# Patient Record
Sex: Female | Born: 1935 | Race: White | Hispanic: No
Health system: Southern US, Community
[De-identification: ages and names within clinical notes are randomized; demographics above are authoritative.]

---

## 2004-09-14 ENCOUNTER — Ambulatory Visit: Payer: Self-pay | Admitting: Neurology

## 2004-09-25 ENCOUNTER — Encounter: Payer: Self-pay | Admitting: Neurology

## 2004-09-26 ENCOUNTER — Ambulatory Visit: Payer: Self-pay | Admitting: Internal Medicine

## 2004-10-10 ENCOUNTER — Encounter: Payer: Self-pay | Admitting: Neurology

## 2004-10-30 ENCOUNTER — Ambulatory Visit: Payer: Self-pay | Admitting: *Deleted

## 2005-03-15 ENCOUNTER — Ambulatory Visit: Payer: Self-pay | Admitting: Internal Medicine

## 2005-11-02 ENCOUNTER — Ambulatory Visit: Payer: Self-pay | Admitting: Internal Medicine

## 2006-11-26 ENCOUNTER — Ambulatory Visit: Payer: Self-pay | Admitting: Internal Medicine

## 2007-10-20 ENCOUNTER — Ambulatory Visit: Payer: Self-pay | Admitting: Internal Medicine

## 2008-08-12 ENCOUNTER — Emergency Department: Payer: Self-pay | Admitting: Emergency Medicine

## 2008-08-12 ENCOUNTER — Other Ambulatory Visit: Payer: Self-pay

## 2008-11-25 ENCOUNTER — Ambulatory Visit: Payer: Self-pay | Admitting: Internal Medicine

## 2009-04-04 ENCOUNTER — Encounter: Admission: RE | Admit: 2009-04-04 | Discharge: 2009-04-04 | Payer: Self-pay | Admitting: Neurology

## 2009-04-26 ENCOUNTER — Encounter: Admission: RE | Admit: 2009-04-26 | Discharge: 2009-04-26 | Payer: Self-pay | Admitting: Neurology

## 2009-11-29 ENCOUNTER — Ambulatory Visit: Payer: Self-pay | Admitting: Internal Medicine

## 2010-03-27 ENCOUNTER — Encounter: Payer: Self-pay | Admitting: General Practice

## 2010-04-09 ENCOUNTER — Encounter: Payer: Self-pay | Admitting: General Practice

## 2010-05-10 ENCOUNTER — Encounter: Payer: Self-pay | Admitting: General Practice

## 2010-11-30 ENCOUNTER — Ambulatory Visit: Payer: Self-pay | Admitting: Internal Medicine

## 2011-12-24 ENCOUNTER — Ambulatory Visit: Payer: Self-pay | Admitting: Internal Medicine

## 2012-12-29 ENCOUNTER — Ambulatory Visit: Payer: Self-pay | Admitting: Internal Medicine

## 2013-06-23 ENCOUNTER — Inpatient Hospital Stay: Payer: Self-pay | Admitting: Internal Medicine

## 2013-06-23 LAB — COMPREHENSIVE METABOLIC PANEL
BUN: 23 mg/dL — ABNORMAL HIGH (ref 7–18)
Calcium, Total: 9.4 mg/dL (ref 8.5–10.1)
Chloride: 101 mmol/L (ref 98–107)
Potassium: 4.1 mmol/L (ref 3.5–5.1)
Sodium: 136 mmol/L (ref 136–145)
Total Protein: 8 g/dL (ref 6.4–8.2)

## 2013-06-23 LAB — URINALYSIS, COMPLETE
Bacteria: NONE SEEN
Ketone: NEGATIVE
Leukocyte Esterase: NEGATIVE
Ph: 6 (ref 4.5–8.0)
Protein: NEGATIVE
RBC,UR: 4 /HPF (ref 0–5)
Specific Gravity: 1.01 (ref 1.003–1.030)
WBC UR: <1 /HPF (ref 0–5)

## 2013-06-23 LAB — CBC
HGB: 14.5 g/dL (ref 12.0–16.0)
MCH: 32.5 pg (ref 26.0–34.0)
RBC: 4.47 10*6/uL (ref 3.80–5.20)
RDW: 13.7 % (ref 11.5–14.5)
WBC: 5.6 10*3/uL (ref 3.6–11.0)

## 2013-06-23 LAB — SEDIMENTATION RATE: Erythrocyte Sed Rate: 7 mm/hr (ref 0–30)

## 2013-06-23 LAB — TROPONIN I
Troponin-I: <0.02 ng/mL
Troponin-I: <0.02 ng/mL

## 2013-06-24 LAB — CBC WITH DIFFERENTIAL/PLATELET
Basophil #: 0 10*3/uL (ref 0.0–0.1)
Eosinophil #: 0 10*3/uL (ref 0.0–0.7)
HCT: 38 % (ref 35.0–47.0)
HGB: 13.4 g/dL (ref 12.0–16.0)
Lymphocyte #: 1.8 10*3/uL (ref 1.0–3.6)
Lymphocyte %: 34.4 %
MCHC: 35.2 g/dL (ref 32.0–36.0)
Monocyte #: 0.5 x10 3/mm (ref 0.2–0.9)
Monocyte %: 9.1 %
Neutrophil %: 55.2 %
RDW: 13.9 % (ref 11.5–14.5)
WBC: 5.2 10*3/uL (ref 3.6–11.0)

## 2013-06-24 LAB — BASIC METABOLIC PANEL
Anion Gap: 6 — ABNORMAL LOW (ref 7–16)
BUN: 24 mg/dL — ABNORMAL HIGH (ref 7–18)
Chloride: 106 mmol/L (ref 98–107)
EGFR (African American): 53 — ABNORMAL LOW
EGFR (Non-African Amer.): 45 — ABNORMAL LOW
Glucose: 103 mg/dL — ABNORMAL HIGH (ref 65–99)

## 2013-06-24 LAB — LIPID PANEL
Cholesterol: 215 mg/dL — ABNORMAL HIGH (ref 0–200)
HDL Cholesterol: 46 mg/dL (ref 40–60)
Ldl Cholesterol, Calc: 147 mg/dL — ABNORMAL HIGH (ref 0–100)
Triglycerides: 109 mg/dL (ref 0–200)
VLDL Cholesterol, Calc: 22 mg/dL (ref 5–40)

## 2013-06-24 LAB — TROPONIN I: Troponin-I: <0.02 ng/mL

## 2014-01-04 ENCOUNTER — Ambulatory Visit: Payer: Self-pay | Admitting: Internal Medicine

## 2014-09-09 ENCOUNTER — Ambulatory Visit: Payer: Self-pay | Admitting: Internal Medicine

## 2014-09-29 ENCOUNTER — Inpatient Hospital Stay: Payer: Self-pay | Admitting: Internal Medicine

## 2014-09-29 LAB — BASIC METABOLIC PANEL
ANION GAP: 7 (ref 7–16)
BUN: 47 mg/dL — ABNORMAL HIGH (ref 7–18)
CALCIUM: 9 mg/dL (ref 8.5–10.1)
CHLORIDE: 103 mmol/L (ref 98–107)
CO2: 28 mmol/L (ref 21–32)
CREATININE: 1.57 mg/dL — AB (ref 0.60–1.30)
EGFR (African American): 41 — ABNORMAL LOW
EGFR (Non-African Amer.): 34 — ABNORMAL LOW
Glucose: 76 mg/dL (ref 65–99)
OSMOLALITY: 287 (ref 275–301)
Potassium: 4.2 mmol/L (ref 3.5–5.1)
Sodium: 138 mmol/L (ref 136–145)

## 2014-09-29 LAB — URINALYSIS, COMPLETE
BACTERIA: NONE SEEN
Blood: NEGATIVE
Glucose,UR: NEGATIVE mg/dL (ref 0–75)
Leukocyte Esterase: NEGATIVE
NITRITE: NEGATIVE
Ph: 5 (ref 4.5–8.0)
RBC,UR: 5 /HPF (ref 0–5)
SPECIFIC GRAVITY: 1.026 (ref 1.003–1.030)
Squamous Epithelial: 2

## 2014-09-29 LAB — CBC
HCT: 44.2 % (ref 35.0–47.0)
HGB: 14.1 g/dL (ref 12.0–16.0)
MCH: 31.6 pg (ref 26.0–34.0)
MCHC: 32 g/dL (ref 32.0–36.0)
MCV: 99 fL (ref 80–100)
Platelet: 172 10*3/uL (ref 150–440)
RBC: 4.48 10*6/uL (ref 3.80–5.20)
RDW: 13.4 % (ref 11.5–14.5)
WBC: 7 10*3/uL (ref 3.6–11.0)

## 2014-09-29 LAB — TROPONIN I: Troponin-I: <0.02 ng/mL

## 2014-09-30 LAB — BASIC METABOLIC PANEL
Anion Gap: 9 (ref 7–16)
BUN: 40 mg/dL — ABNORMAL HIGH (ref 7–18)
CALCIUM: 8.7 mg/dL (ref 8.5–10.1)
CO2: 24 mmol/L (ref 21–32)
Chloride: 107 mmol/L (ref 98–107)
Creatinine: 1.12 mg/dL (ref 0.60–1.30)
EGFR (African American): >60
EGFR (Non-African Amer.): 50 — ABNORMAL LOW
GLUCOSE: 111 mg/dL — AB (ref 65–99)
OSMOLALITY: 290 (ref 275–301)
POTASSIUM: 4 mmol/L (ref 3.5–5.1)
Sodium: 140 mmol/L (ref 136–145)

## 2014-09-30 LAB — CBC WITH DIFFERENTIAL/PLATELET
BASOS ABS: 0 10*3/uL (ref 0.0–0.1)
BASOS PCT: 0.3 %
EOS ABS: 0 10*3/uL (ref 0.0–0.7)
Eosinophil %: 0.4 %
HCT: 41.9 % (ref 35.0–47.0)
HGB: 14.1 g/dL (ref 12.0–16.0)
LYMPHS PCT: 21.2 %
Lymphocyte #: 1.2 10*3/uL (ref 1.0–3.6)
MCH: 32.7 pg (ref 26.0–34.0)
MCHC: 33.7 g/dL (ref 32.0–36.0)
MCV: 97 fL (ref 80–100)
Monocyte #: 0.4 x10 3/mm (ref 0.2–0.9)
Monocyte %: 7.8 %
NEUTROS ABS: 4 10*3/uL (ref 1.4–6.5)
Neutrophil %: 70.3 %
Platelet: 155 10*3/uL (ref 150–440)
RBC: 4.32 10*6/uL (ref 3.80–5.20)
RDW: 13.1 % (ref 11.5–14.5)
WBC: 5.7 10*3/uL (ref 3.6–11.0)

## 2014-09-30 LAB — LIPID PANEL
CHOLESTEROL: 155 mg/dL (ref 0–200)
HDL: 35 mg/dL — AB (ref 40–60)
LDL CHOLESTEROL, CALC: 98 mg/dL (ref 0–100)
TRIGLYCERIDES: 108 mg/dL (ref 0–200)
VLDL CHOLESTEROL, CALC: 22 mg/dL (ref 5–40)

## 2014-10-03 LAB — CBC WITH DIFFERENTIAL/PLATELET
BASOS ABS: 0 10*3/uL (ref 0.0–0.1)
BASOS PCT: 0.5 %
EOS PCT: 0.8 %
Eosinophil #: 0.1 10*3/uL (ref 0.0–0.7)
HCT: 43.4 % (ref 35.0–47.0)
HGB: 14.5 g/dL (ref 12.0–16.0)
LYMPHS PCT: 20.5 %
Lymphocyte #: 1.2 10*3/uL (ref 1.0–3.6)
MCH: 32.5 pg (ref 26.0–34.0)
MCHC: 33.5 g/dL (ref 32.0–36.0)
MCV: 97 fL (ref 80–100)
Monocyte #: 0.5 x10 3/mm (ref 0.2–0.9)
Monocyte %: 8.4 %
NEUTROS ABS: 4.2 10*3/uL (ref 1.4–6.5)
Neutrophil %: 69.8 %
Platelet: 138 10*3/uL — ABNORMAL LOW (ref 150–440)
RBC: 4.47 10*6/uL (ref 3.80–5.20)
RDW: 13.3 % (ref 11.5–14.5)
WBC: 6 10*3/uL (ref 3.6–11.0)

## 2014-10-03 LAB — BASIC METABOLIC PANEL
ANION GAP: 9 (ref 7–16)
BUN: 39 mg/dL — ABNORMAL HIGH (ref 7–18)
CO2: 24 mmol/L (ref 21–32)
Calcium, Total: 8.8 mg/dL (ref 8.5–10.1)
Chloride: 103 mmol/L (ref 98–107)
Creatinine: 0.94 mg/dL (ref 0.60–1.30)
Glucose: 105 mg/dL — ABNORMAL HIGH (ref 65–99)
OSMOLALITY: 282 (ref 275–301)
Potassium: 3.9 mmol/L (ref 3.5–5.1)
Sodium: 136 mmol/L (ref 136–145)

## 2014-10-05 ENCOUNTER — Ambulatory Visit: Payer: Self-pay | Admitting: Neurology

## 2014-10-05 LAB — CBC WITH DIFFERENTIAL/PLATELET
Basophil #: 0 10*3/uL (ref 0.0–0.1)
Basophil %: 0.2 %
EOS ABS: 0 10*3/uL (ref 0.0–0.7)
Eosinophil %: 0.3 %
HCT: 41 % (ref 35.0–47.0)
HGB: 13.7 g/dL (ref 12.0–16.0)
Lymphocyte #: 0.7 10*3/uL — ABNORMAL LOW (ref 1.0–3.6)
Lymphocyte %: 7.9 %
MCH: 32.5 pg (ref 26.0–34.0)
MCHC: 33.4 g/dL (ref 32.0–36.0)
MCV: 97 fL (ref 80–100)
MONO ABS: 0.7 x10 3/mm (ref 0.2–0.9)
Monocyte %: 8.4 %
NEUTROS ABS: 7 10*3/uL — AB (ref 1.4–6.5)
Neutrophil %: 83.2 %
PLATELETS: 141 10*3/uL — AB (ref 150–440)
RBC: 4.22 10*6/uL (ref 3.80–5.20)
RDW: 13.3 % (ref 11.5–14.5)
WBC: 8.5 10*3/uL (ref 3.6–11.0)

## 2014-10-10 ENCOUNTER — Ambulatory Visit: Payer: Self-pay | Admitting: Internal Medicine

## 2014-11-09 DEATH — deceased

## 2015-04-01 NOTE — Discharge Summary (Signed)
PATIENT NAME:  Tammie Lane, Tammie Lane MR#:  [id number] DATE OF BIRTH:  12/03/1934  DATE OF ADMISSION:  06/23/2013 DATE OF DISCHARGE:  06/25/2013  DISCHARGE DIAGNOSES: 1.  Acute right retinal artery occlusion.  2.  Parkinson's disease.  3.  Altered mental status secondary to tramadol.  4.  Malignant hypertension.   DISCHARGE MEDICATIONS: 1.  Carbidopa/levodopa 25/100 mg 1/2 tablet t.i.d.  2.  Mirapex 0.5 mg t.i.d.  3.  Aspirin 81 mg daily.  4.  Senna S daily.  5.  Vitamin B12 1000 mcg daily. 6.  Plavix 75 mg daily.  7.  Amlodipine 2.5 mg b.i.d.   REASON FOR ADMISSION: A 81 year old female presents with right vision loss and headache. Please see H and P for HPI, past medical history and physical exam.  HOSPITAL COURSE: The patient was admitted. Brain MRI did not show an acute infarct. Carotid Doppler unremarkable. Echocardiogram showed normal LV systolic function with no significant valvular disease. Due to her right headache, Tramadol was given, and she subsequently had altered mental status and confusion with delirium for about 12 hours.  That resolved this morning. She is back to baseline.  Is instructed never to take that again. Dr. Sherryll BurgerShah of neurology suggested adding the Plavix. We will do that for 3 months. Her vision is about 50% improved this morning. She will follow up with ophthalmology. Her blood pressure has been up and down, sometimes fairly severe. This morning was 170/94. She will be on low-dose amlodipine to avoid hypotension with her Parkinson's.  One week followup.  ____________________________ Danella PentonMark F. Jamieka Royle, MD mfm:sb D: 06/25/2013 07:58:13 ET T: 06/25/2013 08:13:53 ET JOB#: [id number]  cc: Danella PentonMark F. Avian Greenawalt, MD, <Dictator> Danella PentonMARK F Eva Vallee MD ELECTRONICALLY SIGNED 06/25/2013 12:33

## 2015-04-01 NOTE — H&P (Signed)
PATIENT NAME:  Tammie Lane, Tammie Lane MR#:  [id number] DATE OF BIRTH:  [birth year]  DATE OF ADMISSION:  06/23/2013  PRIMARY CARE PHYSICIAN:  Bethann Punches, MD.  NEUROLOGIST:  Dr. Sherryll Burger.   CHIEF COMPLAINT:  Right eye visual loss.   HISTORY OF PRESENT ILLNESS:  This is a 81 year old female with history of hypertension and I think orthostatic hypotension, history of Parkinson's. Over the past 2 days, she has had this on-and-off black vision on the right eye. Today it is continuous and she cannot even see my fingers in front of her right eye. She did have a little nausea. She went to Dr. Larence Penning, optometry, who referred her to the Emergency Room for stroke versus central retinal artery occlusion. As per the family, she has had blood pressure shooting very high and also very low and she has been taken off blood pressure medications. She does complain of some chest pain, had it for an hour and a half today, crushing pain. She takes some acid reflux medications and seems to improve a little bit, and that was in the center of her chest, no radiation. Normally she does unsteady with gait and walks with a [place] secondary to her Parkinson's.   PAST MEDICAL HISTORY: 1.  Parkinson's. 2.  Hypertension. 2.  TIA.  PAST SURGICAL HISTORY: 1.  Tubal ligation. 2.  Hysterectomy.   ALLERGIES:  AVELOX.   MEDICATIONS:  Include: 1.  Aspirin 81 mg daily.  2.  Carbidopa, levodopa 25/100 1/2 tablet 3 times a day. 3.  Pramipexole 0.5 mg 3 times a day. 4.  Senna-S 50, 8.6 one tablet daily as needed for constipation. 5.  Vitamin B12 1000 mcg orally daily.   SOCIAL HISTORY: No smoking. No alcohol. No drug use. Worked in the past as a Careers adviser.   FAMILY HISTORY: Mother died at 39, had a CVA with paralysis one side and was like that 3 years before she died.  Father died at 34 of a MI.  REVIEW OF SYSTEMS:  CONSTITUTIONAL: Positive for generalized weakness. No fever, chills or sweats. No weight loss. No  weight gain. EYES:  Right eye black vision for the past 2 days. EARS, NOSE, MOUTH AND THROAT: Positive for runny nose. No sore throat. No difficulty swallowing.  CARDIOVASCULAR:  Occasional chest pain last day. No palpitations.  RESPIRATORY:  Positive for shortness of breath. No cough. No sputum. No hemoptysis.  GASTROINTESTINAL:  Positive constipation last month. Positive for nausea and no vomiting. No abdominal pain. No bright red blood per rectum. No melena.  GENITOURINARY:  No burning on urination.  No hematuria.  MUSCULOSKELETAL:  Positive for joint pain in the low back.  NEUROLOGIC:  No fainting or blackouts. Vision loss, right eye.  PSYCHIATRIC:  No anxiety or depression.  ENDOCRINE: No thyroid problems. HEMATOLOGIC AND LYMPHATIC:  No anemia.   PHYSICAL EXAMINATION: VITAL SIGNS: Temperature 97.4, pulse 62, respirations 18, blood pressure 231/100, pulse oximetry 96% on room air.  GENERAL:  No respiratory distress.  EYES:  Conjunctivae and lids normal. Pupils equal, round and reactive to light. Extraocular muscles intact. No nystagmus. VISION:  Right eye unable to see fingers in front of her right eye. EARS, NOSE, MOUTH AND THROAT: Tympanic membranes: No erythema. Nasal mucosa: No erythema.  Throat: No erythema, no exudate seen. Lips and gums normal.  NECK: No JVD. No bruits. No lymphadenopathy. No thyromegaly. No thyroid nodules palpated.  RESPIRATORY:  Lungs clear to auscultation. No use of accessory muscles to breathe. No  rhonchi, rales or wheeze heard.  CARDIOVASCULAR:  S1, S2 normal. No gallops, rubs or murmurs heard. Carotid upstroke 2+ bilaterally. No bruits. Dorsalis pedis pulses 2+ bilaterally. No edema of the lower extremity.  ABDOMEN: Soft, nontender. No organomegaly/splenomegaly. Normoactive bowel sounds. No masses felt.  LYMPHATIC:  No lymph nodes in the neck.  MUSCULOSKELETAL: The patient's lower extremities are stiff. No clubbing, edema or cyanosis.  SKIN: Positive  bruising, upper extremities. No other lesions seen. Scab on the back from prior dermatology procedure.  NEUROLOGIC: Vision loss right eye, other cranial nerves intact. Deep tendon reflexes half plus bilateral lower extremities. Power 5 out of 5 upper and lower extremities bilaterally. Babinski negative.  PSYCHIATRIC:  The patient is oriented to person, place and time.   LABORATORY AND RADIOLOGICAL DATA:  Urinalysis: 1+ blood.  CT scan of the head: Subtle hypodensity right basal ganglia.  White blood cell count 5.6, H and H 14.5 and 41.9, platelet count of 148, glucose 109, BUN 23, creatinine 1.17, sodium 136, potassium 4.1, chloride 101, CO2 26, calcium 9.4. Liver function tests normal.   EKG: Normal sinus rhythm, 60 beats per minute, left atrial enlargement, inferior infarct.   ASSESSMENT AND PLAN: 1.  Acute cerebrovascular accident.  Likely this is a retinal artery occlusion. Will admit to the hospital given MRI of the brain, carotid ultrasound, echocardiogram, monitor on telemetry. Since the patient does take aspirin, will take a step up to Plavix. If the stroke workup is negative, may end up needing an ophthalmology evaluation but the patient was seen as outpatient by Dr. Larence PenningNice, optometry, who referred him for further evaluation. I will also check a sedimentation rate to rule out temporal arteritis.  2.  Malignant hypertension and chest pain. Will give a dose of Altace 5 mg x 1 at bedtime and add nitro paste. Goal systolic blood pressure 198, tonight. Will get serial cardiac enzymes and monitor on telemetry. Chest pain could be secondary to the malignant hypertension.  3.  Parkinson's disease on decreased dose of Sinemet and Pramipexole .  4.  Constipation. Continue Senna-S.  5.  Dehydration. Will give gentle IV fluid hydration and recheck a creatinine in the a.m.  TIME SPENT ON ADMISSION:  55 minutes.  CODE STATUS:  THE PATIENT IS A FULL CODE.    ____________________________ Herschell Dimesichard J.  Renae GlossWieting, MD rjw:ce D: 06/23/2013 12:54:26 ET T: 06/23/2013 13:37:09 ET JOB#: [id number]  cc: Herschell Dimesichard J. Renae GlossWieting, MD, <Dictator> Danella PentonMark F. Miller, MD Hemang K. Sherryll BurgerShah, MD  Salley ScarletICHARD J Jamir Rone MD ELECTRONICALLY SIGNED 06/26/2013 17:18

## 2015-04-01 NOTE — Consult Note (Signed)
PATIENT NAME:  Tammie Lane, Tammie Lane MR#:  [id number] DATE OF BIRTH:  [birth year]  DATE OF CONSULTATION:  06/23/2013  REFERRING PHYSICIAN:  Alford Highland, MD   CONSULTING PHYSICIAN:  Hemang K. Sherryll Burger, MD PRIMARY CARE PHYSICIAN: Bethann Punches, MD   REASON FOR CONSULTATION: Right eye visual loss.   HISTORY OF PRESENT ILLNESS: Ms. Tammie Lane is a 81 year old Caucasian female patient of mine with Parkinson's disease with a recent autonomic dysfunction and fluctuation in her blood pressure. Yesterday around 06/22/2013, the patient woke up and felt like she was not able to see from her right eye, lasting for almost 2 hours or so but then it got better. When she woke up this morning on 06/23/2013, she had again no vision from her right eye, so she called her regular ophthalmologist and then her optometrist, Dr. Larence Penning, who referred her to the ER.   The patient has not had any improvement in her right eye vision, but other than that she did not notice any other focal weakness, numbness, or any speech difficulty or difficulty with her cognition.   The patient does have a history of other old stroke into her right parietal region based on her MRI. The patient is taking aspirin on a regular basis. She does have a known hyperlipidemia, but she does not take statin on a regular basis.   The patient does have a history of Parkinson's disease, bradykinesia predominant, but due to her fluctuation in her blood pressure we had decreased the dose of Sinemet to make sure that it is not coming from the Sinemet.  We also discontinued her oxybutynin, which can cause lowering of the blood pressure and orthostatic hypotension as well.   PAST MEDICAL HISTORY: Significant for: Parkinson's disease, hypertension, history of TIA, hyperlipidemia, degenerative disk disease, cervical and lumbar.   PAST SURGICAL HISTORY:  Hysterectomy and tubal ligation.   ALLERGIES: SHE IS ALLERGIC TO AVELOX.   MEDICATIONS: I reviewed her home  medication list.   SOCIAL HISTORY: Significant that she used to work at [employer]  She does not drink alcohol, never had DUI. Does not do recreational drugs.   FAMILY HISTORY: Significant that mother died at the age of 66 with a stroke and paralysis. Her father died at the age of 5 with an MI. She does have a sister with a history of carotid endarterectomy and 1 sister with leukemia.   REVIEW OF SYSTEMS: Positive for a feeling of generalized weakness and fatigue and decreased air movement as well as loss of vision in her right eye. Other 10-system review of systems was asked and was found to be negative.   PHYSICAL EXAMINATION: VITAL SIGNS: Her temperature was 98, pulse 52, respiratory rate 18, blood pressure 131/70, pulse oximetry 95% on room air.  GENERAL: She is an elderly-looking Caucasian female lying in bed surrounded by family members.  LUNGS: Clear to auscultation.  HEART: S1, S2 heart sounds. Carotid exam did not reveal any bruit. There might be some very low intensity bruit from the right carotid artery, but that was not consistent.  MENTAL STATUS: She was alert, oriented. She followed 2-step commands. Her attention and concentration seems to be appropriate. Her language seems to be intact. She does not have neurological neglect.  On her cranial nerves, her pupils are reactive, but she does seem to have some afferent pupillary defect from the right eye. She has a complete loss of vision from her right eye. Her left visual field seems to be full on  confrontational testing. Her face was symmetric. Tongue was midline. Facial sensations were intact. She does have some hypophonia and decreased facial expressions.  On her motor exam, she does have slowness and some stiffness, but otherwise she does not have any tremors.  Her strength also seems to be 5 out of 5.  Her sensations are intact to light touch. Her deep tendon reflexes are symmetric.   ASSESSMENT AND PLAN:  1.  Sudden onset  of vision loss from her right eye and funduscopic exam showing hyperemia on her right funduscopic exam compared to the left with a recent history suggestive of amaurosis fugax.  It is strongly suggestive of retinal infarct.   I reviewed her MRI of the brain with her husband, original images, which do not show any new ischemic lesion into her brain suggestive of any new infarct.  Typically, rectal ischemia does not show up on the MRI of the brain; but, the patient does have an old infarct into her right Sylvian fissure region close to her parietal lobe. She also has other white matter microvascular ischemic changes.   The patient was taking aspirin in the past.  I would like to change it to Plavix, and I will start her on atorvastatin 40 mg. I changed simvastatin 20 to atorvastatin 40.  We will avoid hypotension around peri-stroke to prevent worsening of  the penumbra. The patient had a flu shot. The patient should also take multivitamins.   I talked to the patient and family about stroke risk factors. She also had an ultrasound of her carotid blood vessels which has been unremarkable. The patient should have further stroke work-up with echocardiogram and telemetry monitoring, lipid profile. The patient was advised on visual rehab and things to be careful about, specifically her balance and prevent falls, etc.   2.  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 To make sure that her autonomic  dysfunction is not coming from medication side effect, we have reduced her oxybutynin as well as reducing her dose of Sinemet; but if she continues to have episodes of lowering of the blood pressure, then I believe that it might be part of her parkinsonism rather than medication side effect, and we can resume her Sinemet at the full dose; but as I am changing her 2 medications, such as  Plavix and Lipitor, I am not changing any of her Parkinson's medications now. I explained this to the patient and other family members and answered their questions.   I will follow this patient with you. Feel free to contact me with any further questions. Unfortunately, I am not available tomorrow, but I will follow this patient on July 17th.   ____________________________ Durene CalHemang K. Sherryll BurgerShah, MD hks:cb D: 06/23/2013 20:59:31 ET T: 06/23/2013 22:02:01 ET JOB#: [id number]  cc: Hemang K. Sherryll BurgerShah, MD, <Dictator> Durene CalHEMANG K [facility] MD ELECTRONICALLY SIGNED 07/06/2013 7:37

## 2015-04-01 NOTE — Consult Note (Signed)
Brief Consult Note: Diagnosis: Rt. retinal stroke, bradykinesia predominant prakinson's disease.   Patient was seen by consultant.   Consult note dictated.   Comments: 1) Stroke - neg MRI for new infract (+ve fundoscopic exam) - Rt. retinal ischmia with complete loss of vision in right eye. - neg US - MRI - old infract in right parietal region with other WM changes - agree from switch ASA to plavix 75 mg po qday - was not on statin - start atorvastatin 40 q day finish stroke w/up with ECHO, telemetry monitoring, lipid panel etc. - pt had flu vaccine, should take multivitamines   2) Bradykinesia predominant PD - conti current meds, recent med changes due to orthostatic hypotension. - I am not available tomorrow so will see pt on Thursday or as out pt follow up if gets discharged.  Electronic Signatures: Jolene ProvostShah, Donivan Thammavong Kalpeshkumar (MD)  (Signed 15-Jul-14 21:09)  Authored: Brief Consult Note   Last Updated: 15-Jul-14 21:09 by Jolene ProvostShah, Carrigan Delafuente Kalpeshkumar (MD)

## 2015-04-02 NOTE — Consult Note (Signed)
   Comments   Dr Phifer and I met with pt's husband and two sons. Family updated. They have seen a significant decline in pt's condition while hospitalized. They seem to recognize that she is likely approaching end of life and confirm family decision not to pursue PEG. We dicsussed option of comfort care and transfer to the [facility]. However, family are not yet ready for this. They request a few more days of continued medical treatment to see how patient responds. Will update pt's attending.  Electronic Signatures: Borders, Kirt Boys (NP)  (Signed 28-Oct-15 16:28)  Authored: Palliative Care Phifer, Izora Gala (MD)  (Signed 28-Oct-15 17:48)  Authored: Palliative Care   Last Updated: 28-Oct-15 17:48 by Phifer, Izora Gala (MD)

## 2015-04-02 NOTE — Discharge Summary (Signed)
PATIENT NAME:  Tammie Lane, Tammie Lane MR#:  [id number] DATE OF BIRTH:  Feb 24, 1934  DATE OF ADMISSION:  09/29/2014 DATE OF DISCHARGE:  10/07/2014   DISCHARGE DIAGNOSES: 1.  End-stage Parkinson disease with malnutrition.  2.  Acute cerebral vascular accident.  3.  Aspiration pneumonia.  4.  Hypertension.   DISCHARGE MEDICATIONS: Scopolamine patch every three days, carbidopa levodopa 25/100 five times daily.   REASON FOR ADMISSION: A 81 year old female who presents with lethargy and inability to move with cough and congestion. Please see H and P for HPI, past medical history and physical exam.   HOSPITAL COURSE: The patient was admitted. It was clear that her Parkinson's had worsened.  Neurology maximized her medication; still with very little improvement. MRI did show some acute infarcts but the overwhelming issue was her progressive Parkinson's which had been getting worse over the last few years. She aspirated on G-tube feeds and was treated with IV Zosyn. Family decided, with her poor clinical course, to pursue hospice home.     ____________________________ Danella PentonMark F. Willies Laviolette, MD mfm:jp D: 10/07/2014 07:33:10 ET T: 10/07/2014 16:09:29 ET JOB#: [id number]  cc: Danella PentonMark F. Navah Grondin, MD, <Dictator> Rahim Astorga Sherlene ShamsF Glorian Mcdonell MD ELECTRONICALLY SIGNED 10/08/2014 8:04

## 2015-04-02 NOTE — Consult Note (Signed)
As the family wishes no invasive intervention for nutritional purposes I will sign off, please reconsult if needed.  Electronic Signatures: Scot JunElliott, Robert T (MD)  (Signed on 27-Oct-15 17:05)  Authored  Last Updated: 27-Oct-15 17:05 by Scot JunElliott, Robert T (MD)

## 2015-04-02 NOTE — Consult Note (Signed)
Referring Physician:  Rusty Aus   Primary Care Physician:  Merdis Delay, [address], [place],  [address], [place] [phone]  Reason for Consult: Admit Date: 29-Sep-2014  Chief Complaint: stroke  Reason for Consult: CVA   History of Present Illness: History of Present Illness:   81 yo RHD F presents to [facility] secondary to multiple freezing episodes and new R arm weakness.  Pt has had Parkinsons for several years and sees Dr. Manuella Ghazi in [facility].  She has had multiple adjustments and recently had dopamine agonist added.  Family complains of multiple freezing episodes lasting 15 minutes daily.  There are no obvious dyskinesia explained.  However, yesterday pt had a prolonged freezing spell for like 3 hours and then had R sided weakness when she awakened which is new.  She never had focal deficits with episodes before.  ROS:  General weakness   HEENT no complaints   Lungs no complaints   Cardiac no complaints   GI no complaints   GU no complaints   Musculoskeletal no complaints   Extremities no complaints   Skin no complaints   Neuro R sided weakness, freezing   Endocrine no complaints   Psych no complaints   Past Medical/Surgical Hx:  cva: behind (L) eye in 2014  Hypercholesterolemia:   Hypercholesterolemia:   HTN:   Parkinson's Disease:   Past Medical/ Surgical Hx:  Past Medical History reviewed by me as above   Past Surgical History reviewed by me as above   Home Medications: Medication Instructions Last Modified Date/Time  carbidopa-levodopa 25 mg-100 mg oral tablet 1.5 tab(s) orally 5 times a day 21-Oct-15 13:18  oxybutynin 5 mg oral tablet 1 tab(s) orally once a day 21-Oct-15 13:18  simvastatin 20 mg oral tablet 1 tab(s) orally once a day (in the morning) 21-Oct-15 13:18  pantoprazole 40 mg oral delayed release tablet 1 tab(s) orally once a day 21-Oct-15 13:18  Vitamin D3 1000 intl units oral tablet 1 tab(s) orally once a day  21-Oct-15 13:18  pramipexole 0.5 mg oral tablet 1 tab(s) orally 3 times a day 21-Oct-15 13:18  Vitamin B-12 1000 mcg oral tablet 1 tab(s) orally once a day 21-Oct-15 13:18  clopidogrel 75 mg oral tablet 1 tab(s) orally once a day 21-Oct-15 13:18   Allergies:  Tramadol: Agitation  Allergies:  Allergies tramadol   Social/Family History: Employment Status: retired  Lives With: spouse  Living Arrangements: house  Social History: no tob, no EtOH, no illicits  Family History: F with stroke, no seizures   Vital Signs: **Vital Signs.:   22-Oct-15 05:35  Vital Signs Type Routine  Temperature Temperature (F) 97.9  Celsius 36.6  Temperature Source oral  Pulse Pulse 50  Respirations Respirations 20  Systolic BP Systolic BP 655  Diastolic BP (mmHg) Diastolic BP (mmHg) 96  Mean BP 128  Pulse Ox % Pulse Ox % 97  Pulse Ox Activity Level  With exertion  Oxygen Delivery Room Air/ 21 %   Physical Exam: General: nl weight, NAD  HEENT: normocephalic, sclera nonicteric, oropharynx clear  Neck: supple, no JVD, no bruits  Chest: CTA B, no wheezing, good movement  Cardiac: RRR, no murmurs, no edema, 2+ pulses  Extremities: no C/C/E, FROM   Neurologic Exam: Mental Status: sleepy and oriented only to person,  severe bradyphrenia to the point that we can not test language  Cranial Nerves: PERRLA, EOMI with slow saccades, nl VF, R droop with hypomimia, tongue midline, shoulder  shrug equal  Motor Exam: moves L > R slightly, severe increased tone and bradykinesia, no obvious resting tremor  Deep Tendon Reflexes: 1+/4 B, mute plantars  Sensory Exam: light touch intact, no neglect  Coordination: untestable   Lab Results:  LabObservation:  21-Oct-15 20:32   OBSERVATION Reason for Test  Routine Chem:  22-Oct-15 04:08   Cholesterol, Serum 155  Triglycerides, Serum 108  HDL (INHOUSE)  35  VLDL Cholesterol Calculated 22  LDL Cholesterol Calculated 98 (Result(s) reported on 30 Sep 2014 at  05:03AM.)  Glucose, Serum  111  BUN  40  Creatinine (comp) 1.12  Sodium, Serum 140  Potassium, Serum 4.0  Chloride, Serum 107  CO2, Serum 24  Calcium (Total), Serum 8.7  Anion Gap 9  Osmolality (calc) 290  eGFR (African American) >60  eGFR (Non-African American)  50 (eGFR values <67m/min/1.73 m2 may be an indication of chronic kidney disease (CKD). Calculated eGFR, using the MRDR Study equation, is useful in  patients with stable renal function. The eGFR calculation will not be reliable in acutely ill patients when serum creatinine is changing rapidly. It is not useful in patients on dialysis. The eGFR calculation may not be applicable to patients at the low and high extremes of body sizes, pregnant women, and vetetarians.)  Cardiac:  21-Oct-15 12:21   Troponin I < 0.02 (0.00-0.05 0.05 ng/mL or less: NEGATIVE  Repeat testing in 3-6 hrs  if clinically indicated. >0.05 ng/mL: POTENTIAL  MYOCARDIAL INJURY. Repeat  testing in 3-6 hrs if  clinically indicated. NOTE: An increase or decrease  of 30% or more on serial  testing suggests a  clinically important change)  Routine UA:  21-Oct-15 12:21   Color (UA) Amber  Clarity (UA) Cloudy  Glucose (UA) Negative  Bilirubin (UA) 2+  Ketones (UA) Trace  Specific Gravity (UA) 1.026  Blood (UA) Negative  pH (UA) 5.0  Protein (UA) 100 mg/dL  Nitrite (UA) Negative  Leukocyte Esterase (UA) Negative (Result(s) reported on 29 Sep 2014 at 12:51PM.)  RBC (UA) 5 /HPF  WBC (UA) 17 /HPF  Bacteria (UA) NONE SEEN  Epithelial Cells (UA) 2 /HPF  Mucous (UA) PRESENT  Hyaline Cast (UA) 40 /LPF (Result(s) reported on 29 Sep 2014 at 12:51PM.)  Routine Hem:  22-Oct-15 04:08   WBC (CBC) 5.7  RBC (CBC) 4.32  Hemoglobin (CBC) 14.1  Hematocrit (CBC) 41.9  Platelet Count (CBC) 155  MCV 97  MCH 32.7  MCHC 33.7  RDW 13.1  Neutrophil % 70.3  Lymphocyte % 21.2  Monocyte % 7.8  Eosinophil % 0.4  Basophil % 0.3  Neutrophil # 4.0  Lymphocyte  # 1.2  Monocyte # 0.4  Eosinophil # 0.0  Basophil # 0.0 (Result(s) reported on 30 Sep 2014 at 04:57AM.)   Radiology Results: [place]    21-Oct-15 16:00, [place]ilateral  [place] Doppler Bilateral   REASON FOR EXAM:    TIA  COMMENTS:       PROCEDURE: [place] - [place]OT[place] DOPPLER BILATERAL  - Sep 29 2014  4:00PM     CLINICAL DATA:  TIA, altered mental status, hypertension and  syncope.    EXAM:  BILATERAL CAROTID DUPLEX ULTRASOUND    TECHNIQUE:  Grayscale imaging, color Doppler and duplex ultrasound were  performed of bilateral carotid and vertebral arteries in the neck.  COMPARISON:  None.    FINDINGS:  Criteria: Quantification of carotid stenosis is based on velocity  parameters that correlatethe residual internal carotid diameter  with NASCET-based stenosis levels, using the diameter of the distal  internal carotid lumen as the denominator for stenosis measurement.    The following velocity measurements were obtained:    RIGHT    ICA:  67/21 cm/sec    CCA:  213/08 cm/sec  SYSTOLIC ICA/CCA RATIO:  0.6    DIASTOLIC ICA/CCA RATIO:  0.9    ECA:  83 cm/sec    LEFT    ICA:  81/29 cm/sec    CCA:  657/84 cm/sec    SYSTOLIC ICA/CCA RATIO:  0.7    DIASTOLIC ICA/CCA RATIO:  1.5  ECA:  63 cm/sec    RIGHT CAROTID ARTERY: There is a small amount of partially calcified  plaque at the level of the common carotid artery in the neck. No  focal plaque is identified at the level of the carotid bulb or  internal carotid artery. Velocities and waveforms are normal and  there is no evidence of ICA stenosis.    RIGHT VERTEBRAL ARTERY: Antegrade flow with normal waveform and  velocity.    LEFT CAROTID ARTERY: A moderate amount of partially calcified plaque  is noted in the common carotid artery in the neck and extending up  to the bulb. A minimal amount of smooth plaque is identified in the  proximal ICA with normal velocities and waveforms present. Estimated  left  ICA stenosis is less than 50%.    LEFT VERTEBRAL ARTERY: Antegrade flow with normal waveform and  velocity.     IMPRESSION:  Minimal plaque in the right common carotid artery with no evidence  of right ICA stenosis in the neck. Moderate plaque in the left  common carotid artery and minimal plaque in the left proximal ICA  with estimated left ICA stenosis of less than 50%      Electronically Signed    By: Aletta Edouard M.D.    On: 09/29/2014 17:07     Verified By: Azzie Roup, M.D.,  MRI:    21-Oct-15 17:04, MRI Brain Without Contrast  MRI Brain Without Contrast   REASON FOR EXAM:    CVA  COMMENTS:       PROCEDURE: MR  - MR BRAIN WO CONTRAST  - Sep 29 2014  5:04PM     CLINICAL DATA:  Acute onset of right-sided weakness for 1 day.  Altered mental status. History of prior CVA.    EXAM:  MRI HEAD WITHOUT CONTRAST    TECHNIQUE:  Multiplanar, multiecho pulse sequences of the brain and surrounding  structures were obtained without intravenous contrast.  COMPARISON:  CT head without contrast 09/29/2014.    FINDINGS:  The diffusion-weighted images demonstrate an acute nonhemorrhagic  infarct within the anterior medial left frontal lobe, within the ACA  territory of the pericallosal branch. A more focal infarct is  present in the region of the left globus pallidus. A remote right  temporal lobe infarct is re-demonstrated. T2 signal is associated  with both of the acute infarcts. A remote lacunar infarct of the  left caudate head is stable. Periventricular and subcortical white  matter changes are otherwise stable. There are dilated perivascular  spaces within the midbrain bilaterally. Remote lacunar infarcts or  dilated perivascular spaces are noted within the right lentiform  nucleus.  Flow is present in the major intracranial arteries. The globes and  orbits are intact. There is chronic opacification of the left  maxillary sinus in the right sphenoid sinus. The  remaining paranasal  sinuses are clear. There is  minimal fluid in the right mastoid air  cells. No obstructing nasopharyngeal lesion is evident.     IMPRESSION:  1. Acute nonhemorrhagic infarct involving the medial left anterior  frontal lobe, likely associated with the left pericallosal artery.  2. Acute nonhemorrhagic infarct of the left globus pallidus.  3. Expected evolution of remote infarct in the superior gyrus right  temporal lobe.  4. Stable remote lacunar infarcts of the basal ganglia otherwise.  5. Dilated perivascular spaces within the basal ganglia and  midbrain.  6. Stable periventricular and subcortical white matter disease,  likely reflecting the sequela of chronic microvascular ischemia.      Electronically Signed    By: Lawrence Santiago M.D.    On: 09/29/2014 17:51         Verified By: Resa Miner. MATTERN, M.D.,  CT:    21-Oct-15 12:44, CT Head Without Contrast  CT Head Without Contrast   REASON FOR EXAM:    RIGHT ARM WEAKNESS, NOT SPEAKING  COMMENTS:       PROCEDURE: CT  - CT HEAD WITHOUT CONTRAST  - Sep 29 2014 12:44PM     CLINICAL DATA:  Right arm weakness, altered mental status    EXAM:  CT HEAD WITHOUT CONTRAST    TECHNIQUE:  Contiguous axial images were obtained from the base of the skull  through the vertex without intravenous contrast.    COMPARISON:  MRI brain 06/23/2013, CT brain 06/23/2013  FINDINGS:  There is no evidence of mass effect, midline shift, or extra-axial  fluid collections. There is no evidence of a space-occupying lesion  or intracranial hemorrhage. There is no evidence of a cortical-based  area of acute infarction. Small, old left basal ganglia lacunar  infarct. There is generalized cerebral atrophy. There is  periventricular white matter low attenuation likely secondary to  microangiopathy.    The ventricles and sulci are appropriate for the patient's age. The  basal cisterns are patent.    Visualized portions of the  orbits are unremarkable. Right sphenoid  mucosal thickening. Cerebrovascular atherosclerotic calcifications  are noted.  The osseous structures are unremarkable.     IMPRESSION:  No acute intracranial pathology.      Electronically Signed    By: Kathreen Devoid    On: 09/29/2014 13:06         Verified By: Jennette Banker, M.D., MD   Radiology Impression: Radiology Impression: MRI of brain personally reviewed by me and shows an acute L ACA and L BG infarct, these are two different vascular territories off the L ICA   Impression/Recommendations: Recommendations:   prior notes reviewed by me reviewed by me   Acute L embolic stroke-  symptomatic but not expected to cause as too much weakness due to location;  suspect cardioembolic but this could be thromboembolic as well Parkinsons-  not controlled as pt has too many freezing episodes, chronic problem which has worsened CTA of head and neck once Cr improves add ASA 46m daily x 3 months continue plavix daily and statin will need event monitor if CTA is neg change Sinemet 25/100 1.5 tabs at 8,11, 2, 5 add Comtan 2072mat sinemet doses add Sinemet 50/200 qHS continue Requip 0.67m19mID continue PT/OT will follow  Electronic Signatures: SmiJamison NeighborD)  (Signed 22-Oct-15 10:07)  Authored: REFERRING PHYSICIAN, Primary Care Physician, Consult, History of Present Illness, Review of Systems, PAST MEDICAL/SURGICAL HISTORY, HOME MEDICATIONS, ALLERGIES, Social/Family History, NURSING VITAL SIGNS, Physical Exam-, LAB RESULTS, RADIOLOGY RESULTS, Recommendations  Last Updated: 22-Oct-15 10:07 by Jamison Neighbor (MD)

## 2015-04-02 NOTE — Consult Note (Signed)
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  Since she has obvious CVA and other brain damages on MR she may have a poor prognosis.  The sons I talked to want to wait for a few days on the feeding tube (PEG) and this will be OK since she has been on Plavix and we would like to wait a few days if we were to do the PEG.  Will talk to Dr. Katrinka BlazingSmith and hold PEG placement for a few days with Friday being a possible date if the family wants to proceed.  Electronic Signatures: Scot JunElliott, Nnenna Meador T (MD)  (Signed on 26-Oct-15 20:04)  Authored  Last Updated: 26-Oct-15 20:04 by Scot JunElliott, Tamura Lasky T (MD)

## 2015-04-02 NOTE — Consult Note (Signed)
PATIENT NAME:  Tammie Lane, Tammie Lane MR#:  [id number] DATE OF BIRTH:  November 18, 1934  DATE OF CONSULTATION:  10/04/2014  REFERRING PHYSICIAN:  Danella PentonMark F. Miller, MD CONSULTING PHYSICIAN:  Adella HareJ. Wilton Smith, MD  SURGERY CONSULTATION NOTE  HISTORY OF PRESENT ILLNESS: This 81 year old female, accompanied by her son and daughter-in-law, was referred by Dr. Bethann PunchesMark Miller with a chief complaint of a need for a feeding tube. She does have a history of Parkinson disease and as o last week had been doing some walking in her home. She has had a number of spells, which  the family describes as "freezing" episodes in that she stops talking and minimal movement. This last week, she has also developed weakness of the right side of her body, which appears that it developed in somewhat of a stepwise fashion, and now has some residual paralysis of the right arm and also a minimal movement of the right leg. She has continued to have spells of inactivity. At other times she is more awake and nods her head, and has very minimal degree of speech. She also has had some fever and coughing, and x-ray findings suggestive of pneumonitis, and currently with a diagnosis of aspiration pneumonia. She has had Zosyn ordered for today. She also has been treated with Plavix and aspirin and subcutaneous heparin. I was asked to see her for consultation regarding percutaneous endoscopic gastrostomy. Dr. Mechele CollinElliott also is being consulted about this.   PAST MEDICAL HISTORY DOES INCLUDE:  1.  The above-mentioned Parkinson disease.  2.  History of transient ischemic attack.  3.  Hypertension.   SURGICAL HISTORY INCLUDES: Tubal ligation and hysterectomy.   MEDICATIONS INCLUDE: The above-mentioned mini dose subcutaneous heparin, Plavix, aspirin, Sinemet CR 25/100 mg 1 tablet 4 times per day, and amlodipine 2.5 mg daily.   DRUG ALLERGIES: TRAMADOL.   SOCIAL HISTORY: She does not smoke, does not drink any alcohol. She had been doing some minimal amount of  walking in her home prior to the acute illness. Lives with her husband at home.   FAMILY HISTORY: Positive for stroke and heart disease.   REVIEW OF SYSTEMS: Much of this was unobtainable, as the patient is very lethargic. The daughter-in-law does point out that the patient has been incontinent of urine and stool. She has not done any walking since admission to the hospital, has had a frequent cough, has continued to be somnolent for long periods of time. Review of systems otherwise is not obtainable.   PHYSICAL EXAMINATION:  GENERAL: She is seen resting in the hospital bed. She has a minimal amount of movement during the course of the exam. It is noted that, when the nurse was changing the tape around her nasogastric tube, she did have some spontaneous movement in both legs, although it seems that the movement in a semi-purposeful and appears to have some posturing of the right leg. There is no movement seen in the right arm. She does have some minimal degree of cogwheel rigidity of the left arm, and the right arm is flaccid.  VITAL SIGNS: Temperature 97.8, pulse 53, respirations 18, blood pressure 167/74, oxygen saturation is 96% on room air.  SKIN: Warm and dry. Does have a number of bruises. Her eyes were shut. Palpebral conjunctivae, normal red color. Pupils are reactive to light. Mouth was examined with a gloved finger, and incisors are just about a centimeter apart. There is no gross food or fluid in the mouth. Mouth is moist.  NECK: No palpable mass.  LUNGS: With few adventitious sounds.  HEART: Regular rhythm, S1 and S2.  ABDOMEN: Minimally obese and soft and nontender, with no palpable mass.  EXTREMITIES: As noted above. There is a trace of dependent ankle edema.  NEUROLOGIC: As noted above.   CLINICAL DATA: Latest creatinine is 0.94, and glucose of 105. Her white blood count was 6000, hemoglobin 14.5, platelet count was low at 138,000. Chest x-ray with mild left basilar subsegmental  atelectasis, possible pneumonitis, mild cardiomegaly.   IMPRESSION:  1.  Recent cerebrovascular accident.  2.  Parkinson disease.  3.  Dysphagia.  4.  Thrombocytopenia.   PLAN: I discussed with the daughter-in-law, and later with the son, percutaneous endoscopic gastrostomy. I also discussed with Dr. Mechele Collin, who will see her for consultation.   Recommend holding her Plavix and aspirin at present, and also will need to hold her heparin prior to the procedure. We will plan to recheck her platelet count tomorrow.    ____________________________ Shela Commons. Renda Rolls, MD jws:MT D: 10/04/2014 12:23:52 ET T: 10/04/2014 14:45:47 ET JOB#: [id number]  cc: Adella Hare, MD, <Dictator> Adella Hare MD ELECTRONICALLY SIGNED 10/04/2014 15:35

## 2015-04-02 NOTE — H&P (Signed)
PATIENT NAME:  Tammie Lane, Tammie Lane MR#:  [id number] DATE OF BIRTH:  1934/11/17  DATE OF ADMISSION:  09/29/2014  PRIMARY CARE PHYSICIAN: Dr. Bethann Punches.  PRIMARY NEUROLOGIST: Dr. Cristopher Peru.  REFERRING EMERGENCY ROOM PHYSICIAN:  Dr. Governor Rooks.  CHIEF COMPLAINT: Right-sided weakness.   HISTORY OF PRESENT ILLNESS: A 81 year old female with history of Parkinson disease, hypertension, and TIAs in the past. She has frequent freezing spells with Parkinson disease. Last night she had again the spell but generally after her freezing spells last for a few hours, she is totally alert, oriented and starts moving all her limbs. Last night, during the spells she had the loss of her bladder control also and today morning, when she finished having the spell, she could not move her right side of the body. Left side of the body movement were normal and she did not speak, which speech problem was also new so son and husband decided to call EMS and bring her to the Emergency Room. After the arrival to ER her symptoms recovered and she was totally back to her baseline normal after her freezing spells, so ER physician decided maybe she might had a TIA and given to hospitalist team for further management.   REVIEW OF SYSTEMS:  CONSTITUTIONAL: Negative for fever, fatigue, weakness, pain, or weight loss.  EYES: No blurry or double vision, discharge, or redness.  EARS, NOSE, THROAT: No tinnitus, ear pain, or hearing loss.  RESPIRATORY: No cough, wheezing, hemoptysis, or shortness of breath.  CARDIOVASCULAR: No chest pain, orthopnea, edema, arrhythmia, palpitations.  GASTROINTESTINAL: No nausea, vomiting, diarrhea, abdominal pain.  GENITOURINARY: No dysuria, hematuria, or increased frequency.  ENDOCRINE: No heat or cold intolerance. No excessive sweating.  SKIN: No acne, rashes, or lesions.  MUSCULOSKELETAL: No pain or swelling in the joints.  NEUROLOGICAL: No numbness, tremors, or vertigo. Has weakness on the right  side of the body. PSYCHIATRIC: Does not appear in any acute illness at this time, but has Parkinson disease and does not talk much.  PAST MEDICAL HISTORY: 1. Parkinson disease. 2. Hypertension.  3. TIA.   PAST SURGICAL HISTORY:  1. Tubal ligation.  2. Hysterectomy.  ALLERGIES: AVELOX.  SOCIAL HISTORY: No smoking. No alcohol. No drug use. Work in [employer] in the past. She uses a [employer] and lives with her husband at home.   FAMILY HISTORY: Mother died at 35, had a CVA with paralysis 1 side 3 years before she died and father died at 32 of myocardial infarction.   MEDICATIONS:  1. Vitamin D 3000 international units once a day.  2. Vitamin B12 at 1000 mcg once a day.  3. Simvastatin 20 mg once a day. 4. Pramipexole 0.5 mg oral 3 times a day.  5. Pantoprazole 40 mg once a day.  6. Oxybutynin 5 mg once a day. 7. Clopidogrel 75 mg once a day. 8. Carbidopa and levodopa 25 + 100 mg 1.5 tablets 5 times a day.   PHYSICAL EXAMINATION:  VITAL SIGNS: In ER, temperature 97.5, pulse rate 59, respirations 16, blood pressure 179/81. Pulse oximetry is 98% on room air.  GENERAL: The patient is alert, but overall very slow reactive to does not speak much but appears to be oriented to time, place, and person.  HEENT: Head and neck atraumatic. Conjunctivae pink. Oral mucosa moist.  NECK: Supple. No JVD.  RESPIRATORY: Bilateral equal air entry.  CARDIOVASCULAR: S1, S2 present, regular. No murmur.  ABDOMEN: Soft, nontender. Bowel sounds present. No organomegaly.  SKIN: No  rashes, acne, or lesions.  MUSCULOSKELETAL: No pain or swelling in the joints.  NEUROLOGICAL: No numbness, weakness. Power 4/5 in all 4 limbs. Follows commands. Generalized weakness. No focal weakness noticed.  PSYCHIATRIC: Does not appear in any acute psychiatric illness at this time.  IMPORTANT LABORATORY RESULTS: Glucose 76, BUN 47, creatinine 1.57, sodium 138, potassium is 4.2, chloride is 106, CO2 of 28,  and potassium is 9. Troponin less than 0.02. WBC 7, hemoglobin 14.1, platelet count 172,000, MCV is 99.   Urinalysis is borderline 17 WBC, but no leukocyte esterase, or no nitrate.   CT of the head without contrast: No acute intracranial pathology.  done in July last year and showed EF 50 to 55, moderate left ventricular hypertrophy and mild mitral valve regurgitation   ASSESSMENT AND PLAN: A 81 year old female with history of Parkinson's, hypertension, and transient ischemic attack came after freezing spell of Parkinson disease but she did not recover totally and had right-sided weakness after that which recovered after coming to Emergency Room which is unusual and possible transient ischemic attack.  1. Transient ischemic attack. We will admit her to telemetry monitoring and will do MRI, echocardiogram, and carotid Doppler study to rule out further source of transient ischemic attack. Meanwhile we'll continue her aspirin and Plavix as she is taking at home and statin and will check lipid panel. I spoke to neurologist to follow her in our office, Dr. Sherryll BurgerShah, and will also get a neurology consult in the hospital if she needs readjustment of her Parkinson's medication. 2. Will also get physical therapy and speech evaluation because of her symptoms to evaluate her functional status, now.  3. Hypertension. Blood pressure is on the higher side currently. We will continue hydralazine injection as-needed basis.  4. Parkinson disease. We will continue carbidopa and levodopa plus pramipexole.  5. Hyperlipidemia. Continue simvastatin.   TOTAL TIME SPENT ON THIS ADMISSION: 50 minutes.    ____________________________ Hope PigeonVaibhavkumar G. Elisabeth PigeonVachhani, MD vgv:lt D: 09/29/2014 14:59:28 ET T: 09/29/2014 16:07:24 ET JOB#: [id number]  cc: Hope PigeonVaibhavkumar G. Elisabeth PigeonVachhani, MD, <Dictator> Danella PentonMark F. Miller, MD Hemang K. Sherryll BurgerShah, MD Altamese DillingVAIBHAVKUMAR Valissa Lyvers MD ELECTRONICALLY SIGNED 10/20/2014 22:14

## 2015-04-02 NOTE — Consult Note (Signed)
PATIENT NAME:  Tammie Lane, Tammie Lane MR#:  [id number] DATE OF BIRTH:  1933/12/29  DATE OF CONSULTATION:  10/04/2014  REFERRING PHYSICIAN:    CONSULTING PHYSICIAN:  Keturah Barre, NP  GASTROINTESTINAL CONSULT: Ordered by Hyacinth Meeker, MD, for evaluation of PEG placement.   HISTORY OF PRESENT ILLNESS: I appreciate consult for this 81 year old Caucasian woman with a history of end-stage Parkinson's and recent acute CVA for evaluation of PEG placement due to inability to tolerate p.o. intake and malnutrition; admitted last week with acute CVA/worse Parkinson disease with increasing frequency of freezing spells, currently receiving ensure via NG feedings as her general condition, alertness and ability to swallow has been declining significantly over the last 2 to 3 days and she is currently lethargic and unable to swallow; was also evaluated by Dr. Katrinka Blazing for this, and he had recommended to hold her aspirin and Plavix for a few days, hold heparin injections the day prior to procedure and reassess platelet count the day of procedure; they were slightly decreased today. Her daughter-in-law is in the room. There has been no history of any GI issues other than GERD and she is on omeprazole daily for this with apparent good control; has had hysterectomy, but no other known GI surgery. She is currently on Zosyn for concerns of aspiration pneumonia. According to nursing staff, she is tolerating her tube feeds well and has not had any GI problems this visit; last dose Plavix yesterday; last dose aspirin today.   REVIEW OF SYSTEMS:  Ten systems reviewed, significant for increasing right-sided weakness, freezing spells, declining neurological status; she is being evaluated for this visit and today also by her primary provider and by Dr. Katrinka Blazing, her neurologist, significant for some congestion, mild bruising; otherwise unremarkable.   PAST MEDICAL HISTORY: Parkinson's, hypertension, CVA, tubal ligation, hysterectomy.    ALLERGIES: AVELOX, TRAMADOL.   SOCIAL HISTORY:  Prior to admission was using a [employer], lives with her husband at home, no smoking, EtOH, illicits.   FAMILY HISTORY: Significant for CVA, myocardial infarction.   MEDICATIONS: Vitamin D 3000 units once a day, vitamin B12 of 1000 mcg once a day; simvastatin 20 mg daily, Mirapex 0.5 mg t.i.d. pantoprazole 40 mg once a day, oxybutynin 5 mg once a day, Plavix 75 mg once day, carbidopa, levodopa 25/100 mg 1-1/2 tabs 5 times a day.   DIAGNOSTIC DATA: Most recent labs, glucose 105, BUN 39, creatinine 0.94, sodium 136, potassium 3.9, chloride 103, GFR greater than 60; calcium 8.8; troponin less than 0.02, WBC 6.0, hemoglobin 14.5, hematocrit 43.4, platelet count 138,000; red cells normocytic with normal RDW; chest x-ray today with mild left submental atelectasis, questionable pneumonitis, mild cardiomegaly; no pulmonary venous congestion.   VITAL SIGNS: Most recent, temperature 97.8, pulse 53, respiratory rate 18, blood pressure 167/74.   PHYSICAL EXAMINATION:  GENERAL: Ill-appearing elderly woman in no acute distress.  NEUROLOGIC: Lethargic, starts to open eyes to pain, withdraws from pain, once somewhat awake she does have purposeful movement; however, resists opening her eyes, I am unable to assess her pupils at this time.  HEENT: Normocephalic, atraumatic. There is an NG tube inserted into her right naris, it appears to be patent and secure.  NECK: Supple. No lymphadenopathy, JVD.  RESPIRATORY: Respirations eupneic; breath sounds clear to the upper lobes, somewhat coarse to the bilateral lower lobes.  CARDIAC: S1, S2, RRR, no MRG; trace generalized edema.  ABDOMEN: Bowel sounds x 4, soft, nondistended, nontender; no guarding, no rigidity; no signs of discomfort; and no hepatosplenomegaly or  other abnormalities appreciated.  SKIN: Warm, dry, pink, no erythema, no rash; do note some mild ecchymosis.  MUSCULOSKELETAL: Spontaneous movement noted to the  left extremities; no spontaneous movement to right; no clubbing or cyanosis.  PSYCHIATRIC: Unable to appreciate at this time.   IMPRESSION AND PLAN: Cerebrovascular accident, declining neurologic condition, inability to swallow, malnutrition.  I have discussed with Dr. Mechele CollinElliott, and we will plan for a PEG placement in conjunction with Dr. Katrinka BlazingSmith, in the next few days as clinically feasible.   Thank you very much for this consult.   These services were provided by Vevelyn Pathristiane Janise Gora, MSN, [facility], in collaboration with Dr. Lynnae Prudeobert Elliott with whom I discussed this patient in full.    ____________________________ Keturah Barrehristiane H. Rowyn Spilde, NP chl:nt D: 10/04/2014 18:07:18 ET T: 10/04/2014 18:49:21 ET JOB#: [id number]  cc: Keturah Barrehristiane H. Dehaven Sine, NP, <Dictator> Eustaquio MaizeHRISTIANE H Maryrose Colvin FNP ELECTRONICALLY SIGNED 11/01/2014 17:12

## 2015-04-02 NOTE — Consult Note (Signed)
Brief Consult Note: Diagnosis: right sided weakness.   Patient was seen by consultant.   Consult note dictated.   Comments: Appreciate consult for 81 y/o caucasian woman with hx end stage Parkinsons and recent acute CVA for evaluation for PEG placement due to inability to tolerate PO intake and manutrition. Admitted last week with acute CVA/worsening parkinson's with increasing frequency of freezing spells. Currently recieving Ensure via NG feedings, as her general condition, alertness,  and  ability to swallow has been declining signifcantly over the last 2-3d, and she is currently lethargic and unable to swallow. Was also evaluated by Dr Katrinka BlazingSmith for this and he recommended to hold her ASA & plavix for a few days, hold heparin injections the day  prior to procedure, and to reasses platelet count the day of procedure as they were slightly decreased today. Her daughter in law is in the room. There has been no history of any GI issues other than GERD, and she is on Pantoprazole 40mg   daily for this. Has had hysterectomy but no other known GI surgeries. She is currently on Zosyn for concerns of aspiration pneumonia. According to nursing staff she is tolerating her tube feeds well and has not had any Gi problems  this visit.  Last dose plavix yesterday, last dose ASA today. Impression/plan: Have discussed with Dr Markham JordanElliot, we will plan for PEG placement with Dr Katrinka BlazingSmith in the next few days as clinically feasible..  Electronic Signatures: Vevelyn PatLondon, Kynzleigh Bandel H (NP)  (Signed 26-Oct-15 18:08)  Authored: Brief Consult Note   Last Updated: 26-Oct-15 18:08 by Keturah BarreLondon, Illa Enlow H (NP)

## 2016-07-01 IMAGING — CR DG ABDOMEN 1V
1 series · 1 of 1 positions shown · non-contrast
Comparison: None.

CLINICAL DATA: Nasogastric tube placement portable exam 9930 hr
compared to 10/01/2014

EXAM:
ABDOMEN - 1 VIEW

[supine kub]
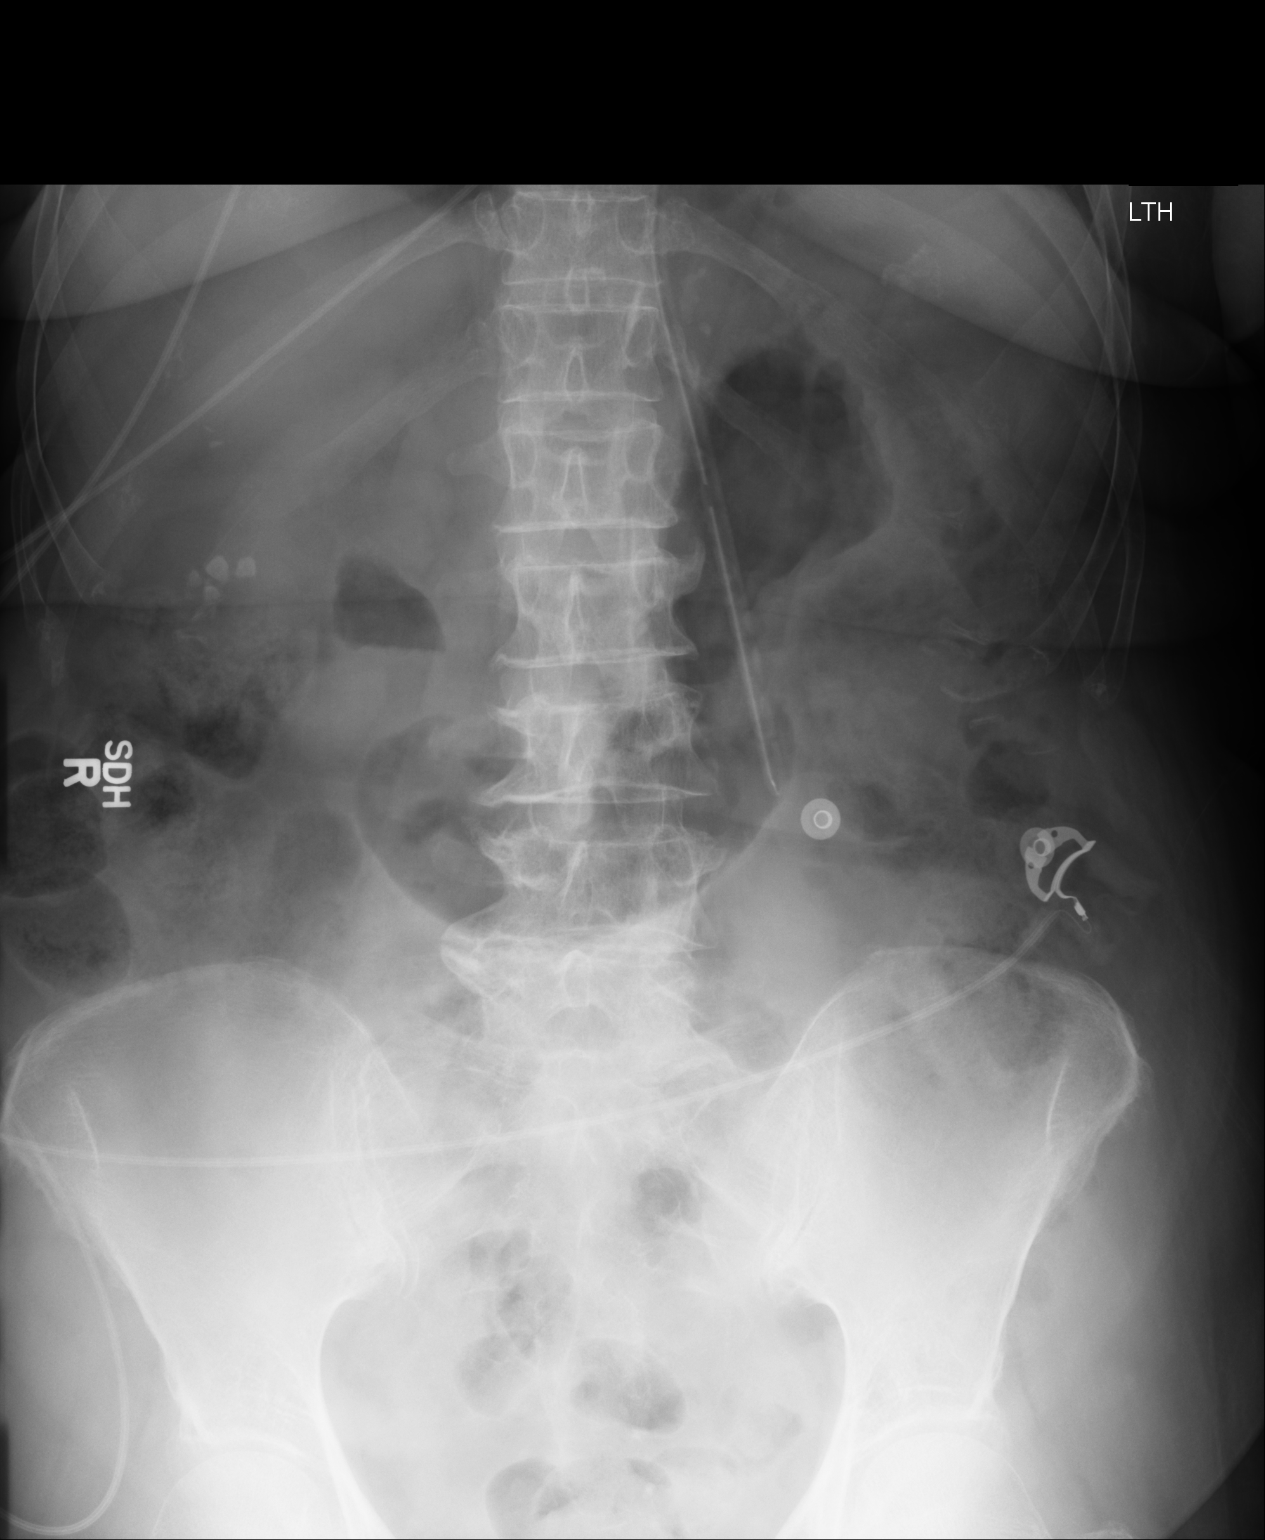

[1 of 1 positions shown; findings below may reference images not displayed]

FINDINGS: Tip of nasogastric tube projects over mid stomach.

Nonobstructive bowel gas pattern.

Multiple calcifications project over the RIGHT upper quadrant likely
gallstones in gallbladder.

Bones appear demineralized with scattered degenerative disc disease
changes lumbar spine.

Lung bases grossly clear.
IMPRESSION: Tip of nasogastric tube projects over mid stomach.

Cholelithiasis.

## 2016-07-02 IMAGING — CR DG CHEST 2V
1 series · 2 of 2 positions shown · non-contrast
Comparison: 06/23/2013 .

CLINICAL DATA: Cough and congestion.

EXAM:
CHEST  2 VIEW

[Series 1: dxr chest pa (or ap) and lateral · 0.14mm/px · 2 of 2 slices shown]
[im 1/2]
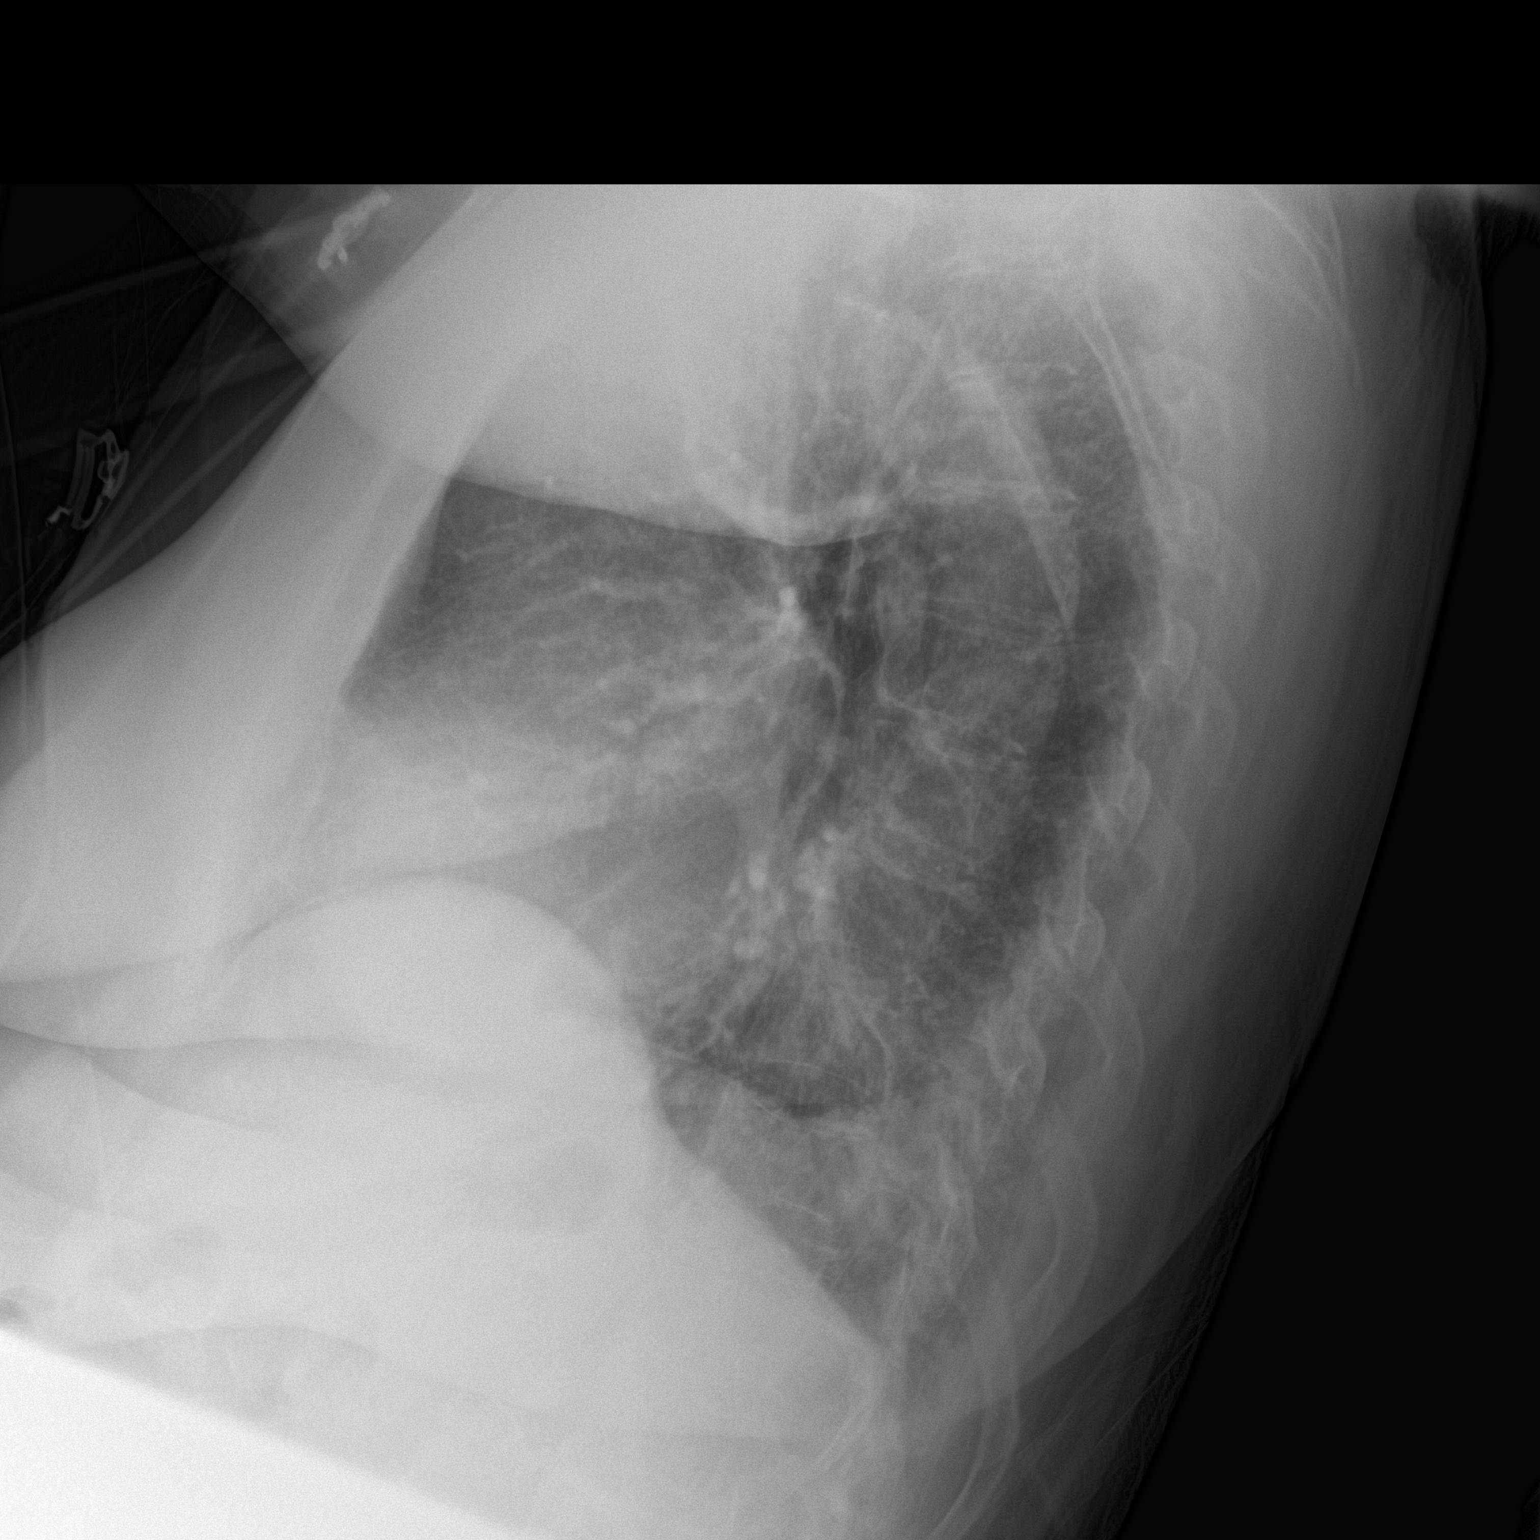
[im 2/2]
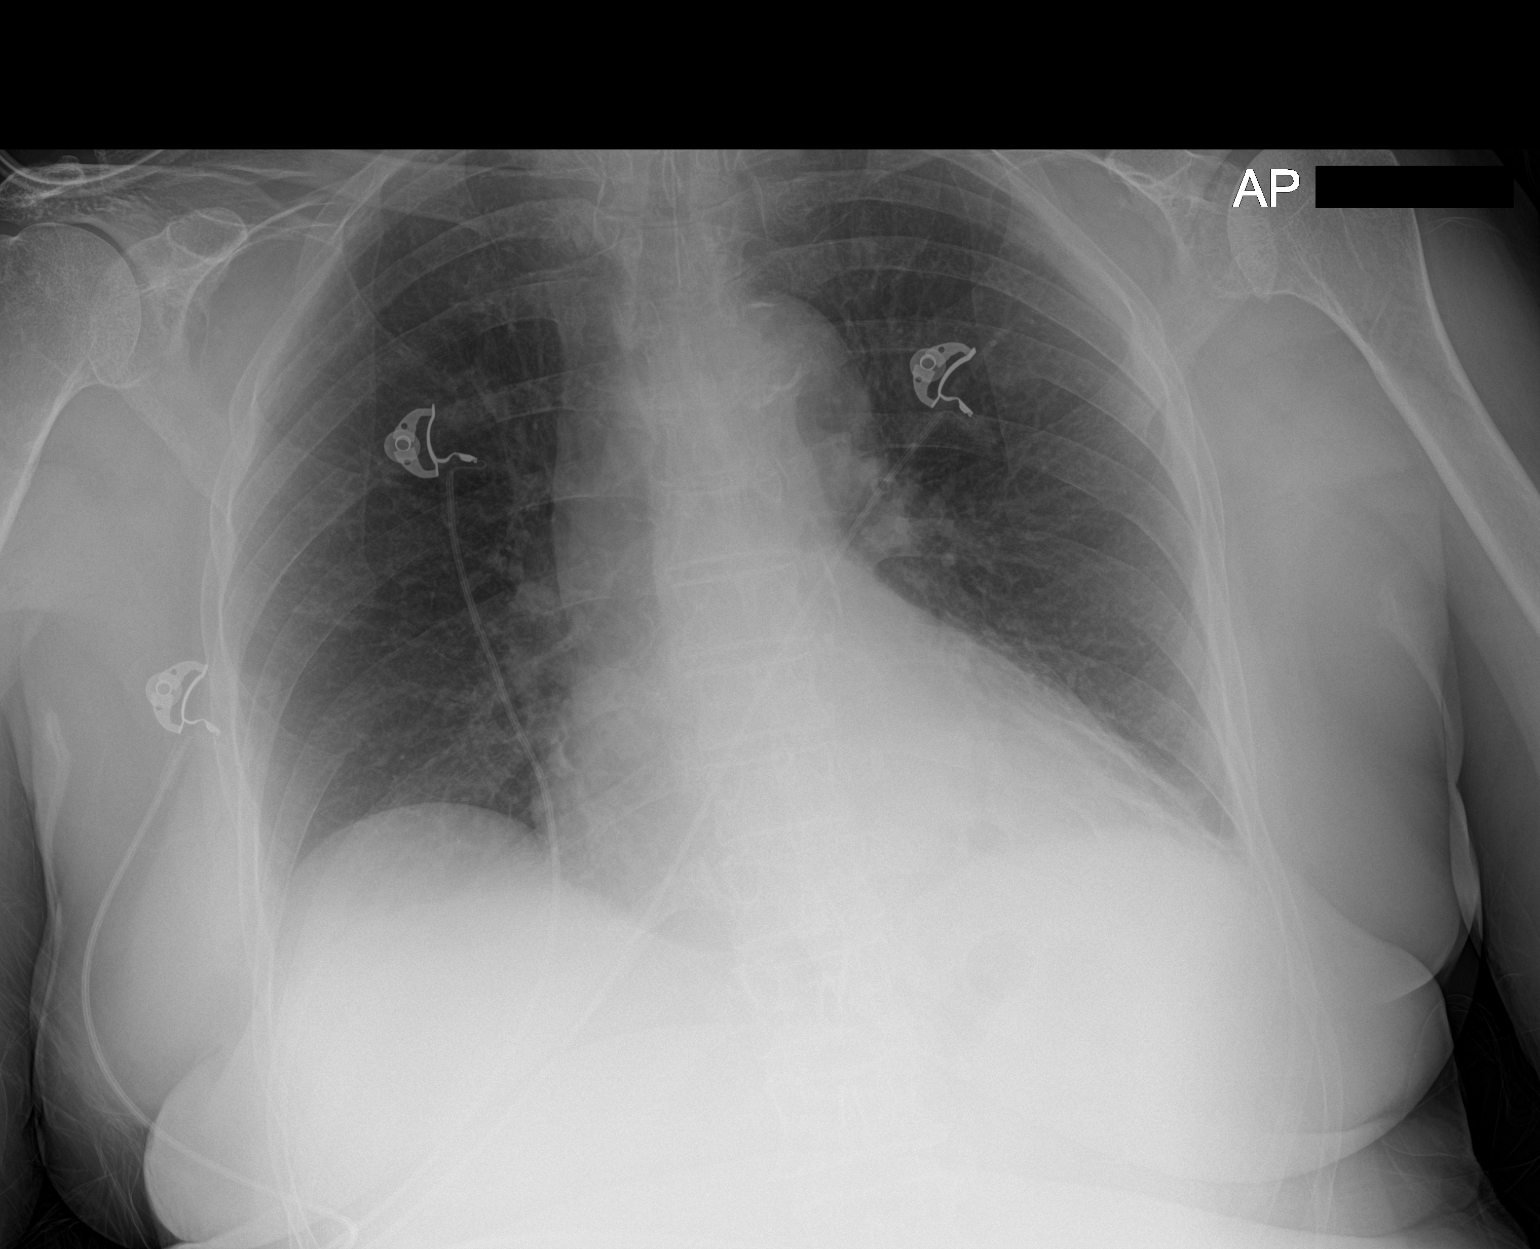

[2 of 2 positions shown; findings below may reference images not displayed]

FINDINGS: Mediastinum hilar structures are normal. Mild cardiomegaly.
Pulmonary vascularity is normal. Mild left base subsegmental
atelectasis versus infiltrate. No significant pleural effusion. No
pneumothorax. Biapical pleural thickening consistent with scarring.
No acute bony abnormality.
IMPRESSION: 1. Mild left basilar subsegmental atelectasis and/or scarring. Mild
pneumonitis in the left lung base cannot be excluded.
2. Mild cardiomegaly, no evidence of pulmonary venous congestion.
